# Patient Record
Sex: Female | Born: 1995 | Race: White | Hispanic: No | Marital: Single | Smoking: Never smoker
Health system: Southern US, Community
[De-identification: ages and names within clinical notes are randomized; demographics above are authoritative.]

---

## 2017-06-21 ENCOUNTER — Ambulatory Visit (HOSPITAL_COMMUNITY)
Admission: EM | Admit: 2017-06-21 | Discharge: 2017-06-21 | Disposition: A | Discharge status: Home / Self Care | Payer: Self-pay | Attending: Family Medicine | Admitting: Family Medicine

## 2017-06-21 ENCOUNTER — Encounter (HOSPITAL_COMMUNITY): Payer: Self-pay | Admitting: Physician Assistant

## 2017-06-21 DIAGNOSIS — S81811A Laceration without foreign body, right lower leg, initial encounter: Secondary | ICD-10-CM

## 2017-06-21 NOTE — ED Provider Notes (Signed)
[facility]    CSN: [id number] Arrival date & time: 06/21/17  1823     History   Chief Complaint Chief Complaint  Patient presents with  . Leg Injury    HPI Heather Stein is a 22 y.o. female.   22 year old female comes in for multiple lacerations to the right lower extremity. States was carrying glass at work, accidentally slipped, and cut her into different locations.  Bleeding controlled.  Came in for evaluation.  Has not taken anything for the pain.  Denies numbness, tingling.  Able to move extremities without problem.  Does not recall when her last  tetanus was.      History reviewed. No pertinent past medical history.  There are no active problems to display for this patient.   History reviewed. No pertinent surgical history.  OB History    No data available       Home Medications    Prior to Admission medications   Medication Sig Start Date End Date Taking? Authorizing Provider  AZITHROMYCIN PO Take by mouth.   Yes [provider]    Family History No family history on file.  Social History Social History   Tobacco Use  . Smoking status: Not on file  Substance Use Topics  . Alcohol use: Not on file  . Drug use: Not on file     Allergies   Patient has no allergy information on record.   Review of Systems Review of Systems  Reason unable to perform ROS: See HPI as above.     Physical Exam Triage Vital Signs ED Triage Vitals  Enc Vitals Group     BP 06/21/17 1852 101/83     Pulse Rate 06/21/17 1852 83     Resp --      Temp 06/21/17 1852 98.3 F (36.8 C)     Temp Source 06/21/17 1852 Oral     SpO2 06/21/17 1852 99 %     Weight --      Height --      Head Circumference --      Peak Flow --      Pain Score 06/21/17 1849 2     Pain Loc --      Pain Edu? --      Excl. in GC? --    No data found.  Updated Vital Signs BP 101/83 (BP Location: Left Arm)   Pulse 83   Temp 98.3 F (36.8 C) (Oral)   SpO2 99%    Physical Exam  Constitutional: She is oriented to person, place, and time. She appears well-developed and well-nourished. No distress.  HENT:  Head: Normocephalic and atraumatic.  Eyes: Conjunctivae are normal. Pupils are equal, round, and reactive to light.  Musculoskeletal:  2 cm laceration to the right anterior thigh. 3.5 cm laceration to the right anterior shin. Bleeding controlled. No foreign body seen. Sensation intact. Able to move extremities without problems.   Neurological: She is alert and oriented to person, place, and time.  Skin: Skin is warm and dry.     UC Treatments / Results  Labs (all labs ordered are listed, but only abnormal results are displayed) Labs Reviewed - No data to display  EKG  EKG Interpretation None       Radiology No results found.  Procedures Laceration Repair Date/Time: 06/21/2017 8:19 PM Performed by: Belinda FisherYu, Teliyah Royal V, PA-C Authorized by: Mardella LaymanHagler, Brian, MD   Consent:    Consent obtained:  Verbal   Consent given  by:  Patient   Risks discussed:  Infection, pain, poor cosmetic result and poor wound healing   Alternatives discussed:  No treatment Anesthesia (see MAR for exact dosages):    Anesthesia method:  Local infiltration   Local anesthetic:  Lidocaine 2% WITH epi Laceration details:    Location:  Leg   Leg location: right upper and lower leg.   Wound length (cm): 2, 3.5.   Laceration depth: 1, 1. Repair type:    Repair type:  Simple Exploration:    Hemostasis achieved with:  Direct pressure and epinephrine   Wound exploration: wound explored through full range of motion and entire depth of wound probed and visualized     Wound extent: no foreign bodies/material noted, no nerve damage noted and no tendon damage noted   Treatment:    Area cleansed with:  Hibiclens   Amount of cleaning:  Standard   Irrigation solution:  Sterile saline   Irrigation method:  Pressure wash Skin repair:    Repair method:  Sutures   Suture size:   4-0   Suture material:  Nylon   Suture technique:  Simple interrupted   Number of sutures: 2 (thigh); 3 (shin) Approximation:    Approximation:  Close   Vermilion border: well-aligned   Post-procedure details:    Dressing:  Antibiotic ointment and bulky dressing   Patient tolerance of procedure:  Tolerated well, no immediate complications    (including critical care time)  Medications Ordered in UC Medications - No data to display   Initial Impression / Assessment and Plan / UC Course  I have reviewed the triage vital signs and the nursing notes.  Pertinent labs & imaging results that were available during my care of the patient were reviewed by me and considered in my medical decision making (see chart for details).    Patient tolerated laceration repair well without immediate complications.  2 sutures applied to laceration on the thigh, 3 sutures applied to laceration of the shin.  Wound care instructions given.  Tylenol/Motrin for pain.  Patient with unknown  last tetanus, though she thinks it is within the last 10 years, states she will contact her mother for information, and will return if tetanus is greater than the last 5 years.  Patient to follow-up in 7 days for suture removal.  Return precautions given.  Patient expresses understanding and agrees to plan.  Final Clinical Impressions(s) / UC Diagnoses   Final diagnoses:  Laceration of multiple sites of right lower extremity, initial encounter    ED Discharge Orders    None       Lurline Idol 06/21/17 2029

## 2017-06-21 NOTE — Discharge Instructions (Signed)
2 stitches was put on your upper thigh.  3 stitches put on your shin. Keep area clean and dry.  You can wash gently with soap and water.  Tylenol/motrin for pain and fever. Monitor for any spreading redness, increased warmth, fever, follow-up for reevaluation.  Otherwise follow-up in 7 days for suture removal.

## 2017-06-21 NOTE — ED Triage Notes (Signed)
Per pt glass broke and hit her right leg,

## 2017-08-25 ENCOUNTER — Ambulatory Visit (HOSPITAL_COMMUNITY)
Admission: EM | Admit: 2017-08-25 | Discharge: 2017-08-25 | Disposition: A | Discharge status: Home / Self Care | Attending: Family Medicine | Admitting: Family Medicine

## 2017-08-25 ENCOUNTER — Encounter (HOSPITAL_COMMUNITY): Payer: Self-pay

## 2017-08-25 ENCOUNTER — Other Ambulatory Visit: Payer: Self-pay

## 2017-08-25 DIAGNOSIS — J029 Acute pharyngitis, unspecified: Secondary | ICD-10-CM

## 2017-08-25 DIAGNOSIS — J358 Other chronic diseases of tonsils and adenoids: Secondary | ICD-10-CM

## 2017-08-25 MED ORDER — AMOXICILLIN 875 MG PO TABS: 875.0000 mg | ORAL_TABLET | Freq: Two times a day (BID) | ORAL | 0 refills | Status: DC

## 2017-08-25 NOTE — ED Provider Notes (Signed)
  MRN: [id number] DOB: 08/26/1995  Subjective:   Heather Stein is a 22 y.o. female presenting for 2-day history of progressively worsening sore throat, subjective fever, bilateral neck discomfort.  Patient also feels like she has a voice change.  Denies cough, sinus congestion, ear pain, sinus pain.  He has been taking Motrin for her throat pain with minimal relief.  Patient has an IUD, Mirena.   Allergies  Allergen Reactions  . Estrogens     Past medical history of infectious mononucleosis.  Denies past surgical history.  Objective:   Vitals: BP 100/61   Pulse 77   Temp 98.6 F (37 C)   Resp 18   Wt 135 lb (61.2 kg)   LMP 08/24/2017   SpO2 100%   Physical Exam  Constitutional: She is oriented to person, place, and time. She appears well-developed and well-nourished.  HENT:  Bilateral tonsillar erythema and swelling with exudates.  Muffled voice noted.  Eyes: Right eye exhibits no discharge. Left eye exhibits no discharge.  Neck: Normal range of motion. Neck supple.  Bilateral cervical lymph node tenderness without lymphadenopathy.  Cardiovascular: Normal rate.  Pulmonary/Chest: Effort normal.  Neurological: She is alert and oriented to person, place, and time.  Skin: Skin is warm and dry.  Psychiatric: She has a normal mood and affect.    Assessment and Plan :   Acute pharyngitis, unspecified etiology  Tonsillar exudate  Start amoxicillin for empiric treatment of strep pharyngitis even physical exam findings. Counseled patient on potential for adverse effects with medications prescribed today, patient verbalized understanding. Return-to-clinic precautions discussed, patient verbalized understanding.  Discussed possibility of morbilliform rash if this turns out to be mono instead of strep.  Patient verbalized understanding.   Wallis BambergMani, Hollis Tuller, [place]-C 08/25/17 1946

## 2017-08-25 NOTE — ED Triage Notes (Signed)
Pt presents with complaints of headache, body aches and sore throat x 2 days.

## 2017-12-09 ENCOUNTER — Ambulatory Visit (HOSPITAL_COMMUNITY)
Admission: EM | Admit: 2017-12-09 | Discharge: 2017-12-09 | Disposition: A | Discharge status: Home / Self Care | Attending: Family Medicine | Admitting: Family Medicine

## 2017-12-09 ENCOUNTER — Encounter (HOSPITAL_COMMUNITY): Payer: Self-pay | Admitting: Emergency Medicine

## 2017-12-09 DIAGNOSIS — J020 Streptococcal pharyngitis: Secondary | ICD-10-CM | POA: Diagnosis not present

## 2017-12-09 LAB — POCT RAPID STREP A: STREPTOCOCCUS, GROUP A SCREEN (DIRECT): POSITIVE — AB

## 2017-12-09 MED ORDER — ACETAMINOPHEN 325 MG PO TABS
ORAL_TABLET | ORAL | Status: AC
  Filled 2017-12-09: qty 2

## 2017-12-09 MED ORDER — AMOXICILLIN 500 MG PO CAPS: 500.0000 mg | ORAL_CAPSULE | Freq: Two times a day (BID) | ORAL | 0 refills | Status: AC

## 2017-12-09 MED ORDER — ACETAMINOPHEN 325 MG PO TABS
650.0000 mg | ORAL_TABLET | Freq: Once | ORAL | Status: AC
  Administered 2017-12-09: 650 mg via ORAL

## 2017-12-09 MED ORDER — ONDANSETRON 4 MG PO TBDP: 4.0000 mg | ORAL_TABLET | Freq: Three times a day (TID) | ORAL | 0 refills | Status: AC | PRN

## 2017-12-09 NOTE — ED Provider Notes (Signed)
[facility]    CSN: [id number] Arrival date & time: 12/09/17  1047     History   Chief Complaint Chief Complaint  Patient presents with  . Sore Throat    HPI Heather Stein is a 22 y.o. female.   22 year old female comes in for 2-day history of sore throat, chills, body aches.  States has some clearing of the throat without obvious cough.  Denies rhinorrhea, nasal congestion.  unknown fever but with alternating hot and chills.  Painful swallowing without trouble breathing, swelling of the throat, changes in voice, drooling, tripoding. Nausea with 1 episode of nonbilious nonbloody emesis. Has been able to tolerate fluids and food since. Denies abdominal pain. No obvious sick contact.  Never smoker.  Has not taken anything for the symptoms.     History reviewed. No pertinent past medical history.  There are no active problems to display for this patient.   History reviewed. No pertinent surgical history.  OB History   None      Home Medications    Prior to Admission medications   Medication Sig Start Date End Date Taking? Authorizing Provider  amoxicillin (AMOXIL) 500 MG capsule Take 1 capsule (500 mg total) by mouth 2 (two) times daily for 10 days. 12/09/17 12/19/17  Belinda FisherYu, Amy V, PA-C  levonorgestrel (MIRENA) 20 MCG/24HR IUD 1 each by Intrauterine route once.    [provider]  ondansetron (ZOFRAN ODT) 4 MG disintegrating tablet Take 1 tablet (4 mg total) by mouth every 8 (eight) hours as needed for nausea or vomiting. 12/09/17   Belinda FisherYu, Amy V, PA-C    Family History Family History  Problem Relation Age of Onset  . Healthy Mother   . Healthy Father     Social History Social History   Tobacco Use  . Smoking status: Never Smoker  . Smokeless tobacco: Current User  Substance Use Topics  . Alcohol use: Yes    Frequency: Never    Comment: occaisionally   . Drug use: Never     Allergies   Estrogens   Review of Systems Review of Systems  Reason  unable to perform ROS: See HPI as above.     Physical Exam Triage Vital Signs ED Triage Vitals [12/09/17 1111]  Enc Vitals Group     BP (!) 101/54     Pulse Rate (!) 111     Resp 18     Temp (!) 101.2 F (38.4 C)     Temp Source Oral     SpO2 98 %     Weight      Height      Head Circumference      Peak Flow      Pain Score      Pain Loc      Pain Edu?      Excl. in GC?    No data found.  Updated Vital Signs BP (!) 101/54 (BP Location: Left Arm)   Pulse (!) 111   Temp (!) 101.2 F (38.4 C) (Oral)   Resp 18   SpO2 98%   Physical Exam  Constitutional: She is oriented to person, place, and time. She appears well-developed and well-nourished.  Non-toxic appearance. She does not appear ill. No distress.  HENT:  Head: Normocephalic and atraumatic.  Right Ear: Tympanic membrane, external ear and ear canal normal. Tympanic membrane is not erythematous and not bulging.  Left Ear: Tympanic membrane, external ear and ear canal normal. Tympanic membrane is not erythematous  and not bulging.  Nose: Nose normal. Right sinus exhibits no maxillary sinus tenderness and no frontal sinus tenderness. Left sinus exhibits no maxillary sinus tenderness and no frontal sinus tenderness.  Mouth/Throat: Uvula is midline and mucous membranes are normal. Posterior oropharyngeal erythema present. Tonsils are 3+ on the right. Tonsils are 3+ on the left. No tonsillar exudate.  Eyes: Pupils are equal, round, and reactive to light. Conjunctivae are normal.  Neck: Normal range of motion. Neck supple.  Cardiovascular: Regular rhythm and normal heart sounds. Tachycardia present. Exam reveals no gallop and no friction rub.  No murmur heard. Pulmonary/Chest: Effort normal and breath sounds normal. No stridor. No respiratory distress. She has no decreased breath sounds. She has no wheezes. She has no rhonchi. She has no rales.  Lymphadenopathy:    She has no cervical adenopathy.  Neurological: She is alert  and oriented to person, place, and time.  Skin: Skin is warm and dry.  Psychiatric: She has a normal mood and affect. Her behavior is normal. Judgment normal.   UC Treatments / Results  Labs (all labs ordered are listed, but only abnormal results are displayed) Labs Reviewed  POCT RAPID STREP A - Abnormal; Notable for the following components:      Result Value   Streptococcus, Group A Screen (Direct) POSITIVE (*)    All other components within normal limits    EKG None  Radiology No results found.  Procedures Procedures (including critical care time)  Medications Ordered in UC Medications  acetaminophen (TYLENOL) tablet 650 mg (650 mg Oral Given 12/09/17 1127)    Initial Impression / Assessment and Plan / UC Course  I have reviewed the triage vital signs and the nursing notes.  Pertinent labs & imaging results that were available during my care of the patient were reviewed by me and considered in my medical decision making (see chart for details).    Rapid strep positive. Start antibiotic as directed. zofran for nausea. Symptomatic treatment as needed. Return precautions given.   Final Clinical Impressions(s) / UC Diagnoses   Final diagnoses:  Strep pharyngitis    ED Prescriptions    Medication Sig Dispense Auth. Provider   amoxicillin (AMOXIL) 500 MG capsule Take 1 capsule (500 mg total) by mouth 2 (two) times daily for 10 days. 20 capsule Yu, Amy V, PA-C   ondansetron (ZOFRAN ODT) 4 MG disintegrating tablet Take 1 tablet (4 mg total) by mouth every 8 (eight) hours as needed for nausea or vomiting. 5 tablet Threasa Alpha, [place] 12/09/17 1155

## 2017-12-09 NOTE — Discharge Instructions (Signed)
Rapid strep positive. Start antibiotics as directed. Zofran as needed for nausea/vomiting. Tylenol/Motrin for fever and pain. Monitor for any worsening of symptoms, trouble breathing, trouble swallowing, swelling of the throat, leaning forward to breath, drooling, follow up here or at the emergency department for reevaluation.  For sore throat/cough try using a honey-based tea. Use 3 teaspoons of honey with juice squeezed from half lemon. Place shaved pieces of ginger into 1/2-1 cup of water and warm over stove top. Then mix the ingredients and repeat every 4 hours as needed.

## 2017-12-09 NOTE — ED Triage Notes (Signed)
Pt sts sore throat with hx of strep; fever noted

## 2018-07-17 ENCOUNTER — Other Ambulatory Visit: Payer: Self-pay

## 2018-07-17 ENCOUNTER — Emergency Department (HOSPITAL_COMMUNITY)
Admission: EM | Admit: 2018-07-17 | Discharge: 2018-07-17 | Disposition: A | Discharge status: Home / Self Care | Attending: Emergency Medicine | Admitting: Emergency Medicine

## 2018-07-17 ENCOUNTER — Emergency Department (HOSPITAL_COMMUNITY)

## 2018-07-17 ENCOUNTER — Encounter (HOSPITAL_COMMUNITY): Payer: Self-pay | Admitting: Emergency Medicine

## 2018-07-17 DIAGNOSIS — F17220 Nicotine dependence, chewing tobacco, uncomplicated: Secondary | ICD-10-CM | POA: Insufficient documentation

## 2018-07-17 DIAGNOSIS — R0789 Other chest pain: Secondary | ICD-10-CM | POA: Diagnosis not present

## 2018-07-17 DIAGNOSIS — J029 Acute pharyngitis, unspecified: Secondary | ICD-10-CM | POA: Insufficient documentation

## 2018-07-17 LAB — GROUP A STREP BY PCR: Group A Strep by PCR: NOT DETECTED

## 2018-07-17 MED ORDER — DEXAMETHASONE 4 MG PO TABS
10.0000 mg | ORAL_TABLET | Freq: Once | ORAL | Status: AC
  Administered 2018-07-17: 10 mg via ORAL
  Filled 2018-07-17: qty 2

## 2018-07-17 NOTE — ED Provider Notes (Signed)
[facility]EMERGENCY DEPT Provider Note   CSN: [id number] Arrival date & time: 07/17/18  1402    History   Chief Complaint Chief Complaint  Patient presents with  . Sore Throat    HPI Heather Stein is a 23 y.o. female who is previously healthy who presents with a sore throat for the past 3 days.  She was around her roommate who has strep throat.  She has pain with swallowing.  She denies fever, nasal congestion, cough, abdominal pain, nausea, vomiting.  Patient also reports she has had an intermittent left-sided chest pain that is pleuritic for the past 40 days.  She reports she vapes and think it may be related.  She has not gotten it checked out.  She denies any trouble breathing.  She denies any new leg pain or swelling, recent long trips, surgeries, known cancer, history of blood clots.  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  She was put on Mirena IUD.  She reports the pain is sometimes worse when she eats as well and has had epigastric pain.  She denies any of that now.     HPI  History reviewed. No pertinent past medical history.  There are no active problems to display for this patient.   History reviewed. No pertinent surgical history.   OB History   No obstetric history on file.      Home Medications    Prior to Admission medications   Medication Sig Start Date End Date Taking? Authorizing Provider  levonorgestrel (MIRENA) 20 MCG/24HR IUD 1 each by Intrauterine route once.    [provider]  ondansetron (ZOFRAN ODT) 4 MG disintegrating tablet Take 1 tablet (4 mg total) by mouth every 8 (eight) hours as needed for nausea or vomiting. 12/09/17   Belinda Fisher, PA-C    Family History Family History  Problem Relation Age of Onset  . Healthy Mother   . Healthy Father     Social History Social History   Tobacco Use  . Smoking status: Never Smoker  . Smokeless tobacco: Current  User  Substance Use Topics  . Alcohol use: Yes    Frequency: Never    Comment: occaisionally   . Drug use: Never     Allergies   Estrogens   Review of Systems Review of Systems  Constitutional: Negative for chills and fever.  HENT: Positive for sore throat. Negative for facial swelling.   Respiratory: Negative for cough and shortness of breath.   Cardiovascular: Positive for chest pain (none at this time). Negative for leg swelling.  Gastrointestinal: Positive for abdominal pain (none at this time). Negative for nausea and vomiting.  Genitourinary: Negative for dysuria.  Musculoskeletal: Negative for back pain.  Skin: Negative for rash and wound.  Neurological: Negative for headaches.  Psychiatric/Behavioral: The patient is not nervous/anxious.      Physical Exam Updated Vital Signs BP 98/65 (BP Location: Right Arm)   Pulse 74   Temp 97.8 F (36.6 C) (Oral)   Resp 18   SpO2 99%   Physical Exam Vitals signs and nursing note reviewed.  Constitutional:      General: She is not in acute distress.    Appearance: She is well-developed. She is not diaphoretic.  HENT:     Head: Normocephalic and atraumatic.     Mouth/Throat:     Pharynx: No oropharyngeal exudate.     Tonsils: Tonsillar exudate present. No  tonsillar abscesses. Swelling: 2+ on the right. 2+ on the left.  Eyes:     General: No scleral icterus.       Right eye: No discharge.        Left eye: No discharge.     Conjunctiva/sclera: Conjunctivae normal.     Pupils: Pupils are equal, round, and reactive to light.  Neck:     Musculoskeletal: Normal range of motion and neck supple.     Thyroid: No thyromegaly.  Cardiovascular:     Rate and Rhythm: Normal rate and regular rhythm.     Heart sounds: Normal heart sounds. No murmur. No friction rub. No gallop.   Pulmonary:     Effort: Pulmonary effort is normal. No respiratory distress.     Breath sounds: Normal breath sounds. No stridor. No wheezing or rales.   Chest:     Chest wall: Tenderness (mild) present.  Abdominal:     General: Bowel sounds are normal. There is no distension.     Palpations: Abdomen is soft.     Tenderness: There is no abdominal tenderness. There is no guarding or rebound.  Musculoskeletal:     Right lower leg: She exhibits no swelling. No edema.     Left lower leg: She exhibits no swelling. No edema.  Lymphadenopathy:     Cervical: No cervical adenopathy (no palpable lymph nodes, but some left tenderness).  Skin:    General: Skin is warm and dry.     Coloration: Skin is not pale.     Findings: No rash.  Neurological:     Mental Status: She is alert.     Coordination: Coordination normal.      ED Treatments / Results  Labs (all labs ordered are listed, but only abnormal results are displayed) Labs Reviewed  GROUP A STREP BY PCR    EKG None  Radiology Dg Chest 2 View  Result Date: 07/17/2018 CLINICAL DATA:  Chest pain EXAM: CHEST - 2 VIEW COMPARISON:  None. FINDINGS: Lungs are clear. The heart size and pulmonary vascularity are normal. No adenopathy. No bone lesions. No pneumothorax. IMPRESSION: No edema or consolidation. Electronically Signed   By: Bretta Bang III M.D.   On: 07/17/2018 16:04    Procedures Procedures (including critical care time)  Medications Ordered in ED Medications  dexamethasone (DECADRON) tablet 10 mg (has no administration in time range)     Initial Impression / Assessment and Plan / ED Course  I have reviewed the triage vital signs and the nursing notes.  Pertinent labs & imaging results that were available during my care of the patient were reviewed by me and considered in my medical decision making (see chart for details).        Patient with a 3-day history of sore throat after exposure to reported strep by her roommate.  Strep PCR today is negative.  Single dose Decadron given in the ED.  Supportive treatment discussed.  Chest x-ray is negative.  Patient's  pain has been intermittent for several weeks.  Very low suspicion of ACS or PE.  Patient is PERC negative.  Suspect related to vaping use or chest wall pain.  NSAIDs discussed.  Counseled on importance of quitting vaping.  Patient reports she has been cutting back.  Patient advised to follow-up and establish care with a PCP in the area.  She is given very strict return precautions including worsening and constant chest pain, shortness of breath, lightheadedness or syncope, trismus, inability to handle her  saliva, or any other concerning symptoms.  She understands and agrees with plan.  Patient vital stable her ED course and discharged in satisfactory condition. I discussed patient case with Dr. Patria Mane who guided the patient's management and agrees with plan.   Final Clinical Impressions(s) / ED Diagnoses   Final diagnoses:  Viral pharyngitis  Atypical chest pain    ED Discharge Orders    None       Emi Holes, Cordelia Poche 07/17/18 1701    Azalia Bilis, MD 07/19/18 [phone]

## 2018-07-17 NOTE — Discharge Instructions (Addendum)
Take ibuprofen or Aleve as prescribed over-the-counter, as needed for your pain.  You can alternate with Tylenol.  Please establish care with a primary care provider follow-up with them if your chest pain is continuing.  Recommend trying to stop vaping.  Please return the emergency department if you develop any more persistent chest pain, shortness of breath, passing out, asymmetry in your throat, difficulty swallowing your own saliva, lockjaw, or any other concerning symptoms.

## 2018-07-17 NOTE — ED Triage Notes (Signed)
Patient here from home with complaints of sore throat x3 days. Also reports left lung pain for "40 days".

## 2018-10-07 ENCOUNTER — Encounter (HOSPITAL_COMMUNITY): Payer: Self-pay | Admitting: Emergency Medicine

## 2018-10-07 ENCOUNTER — Emergency Department (HOSPITAL_COMMUNITY)
Admission: EM | Admit: 2018-10-07 | Discharge: 2018-10-07 | Disposition: A | Discharge status: Home / Self Care | Attending: Emergency Medicine | Admitting: Emergency Medicine

## 2018-10-07 ENCOUNTER — Other Ambulatory Visit: Payer: Self-pay

## 2018-10-07 DIAGNOSIS — Z203 Contact with and (suspected) exposure to rabies: Secondary | ICD-10-CM | POA: Insufficient documentation

## 2018-10-07 DIAGNOSIS — Y929 Unspecified place or not applicable: Secondary | ICD-10-CM | POA: Insufficient documentation

## 2018-10-07 DIAGNOSIS — Y999 Unspecified external cause status: Secondary | ICD-10-CM | POA: Insufficient documentation

## 2018-10-07 DIAGNOSIS — Z23 Encounter for immunization: Secondary | ICD-10-CM | POA: Insufficient documentation

## 2018-10-07 DIAGNOSIS — Z2914 Encounter for prophylactic rabies immune globin: Secondary | ICD-10-CM | POA: Insufficient documentation

## 2018-10-07 DIAGNOSIS — W540XXA Bitten by dog, initial encounter: Secondary | ICD-10-CM | POA: Insufficient documentation

## 2018-10-07 DIAGNOSIS — S6982XA Other specified injuries of left wrist, hand and finger(s), initial encounter: Secondary | ICD-10-CM | POA: Insufficient documentation

## 2018-10-07 DIAGNOSIS — Y9389 Activity, other specified: Secondary | ICD-10-CM | POA: Insufficient documentation

## 2018-10-07 MED ORDER — RABIES VACCINE, PCEC IM SUSR
1.0000 mL | Freq: Once | INTRAMUSCULAR | Status: AC
  Administered 2018-10-07: 1 mL via INTRAMUSCULAR
  Filled 2018-10-07: qty 1

## 2018-10-07 MED ORDER — RABIES IMMUNE GLOBULIN 150 UNIT/ML IM INJ
20.0000 [IU]/kg | INJECTION | Freq: Once | INTRAMUSCULAR | Status: AC
  Administered 2018-10-07: 14:00:00 1125 [IU]
  Filled 2018-10-07: qty 8

## 2018-10-07 NOTE — ED Triage Notes (Signed)
Pt reports that Monday was bit on tip of left middle finger by a stray dog. Was told that needs to come and possibly get rabies shots.

## 2018-10-07 NOTE — Discharge Instructions (Addendum)
Please follow up on Day 3 at Urgent care for your next vaccination - instruction have been included with this paperwork Continue local wound care - wash area with soap and water and watch for signs of infection (worsening redness, pain, drainage) Return to the Emergency Dept as needed

## 2018-10-07 NOTE — ED Notes (Signed)
ED Provider at bedside. 

## 2018-10-07 NOTE — ED Provider Notes (Signed)
[facility]-EMERGENCY DEPT Provider Note   CSN: [id number] Arrival date & time: 10/07/18  1027    History   Chief Complaint Chief Complaint  Patient presents with  . Animal Bite    HPI Heather Stein is a 23 y.o. female who presents with an animal bite. No significant PMH. She states that 2 days ago she was with a friend trying to catch a stray dog. She notes the dog had a collar however it became fearful and bit her on the left distal middle finger. The bite did break the skin. She states she has been taking care of the wound and it has developed a blood blister in the area but is healing well. She was told that she should come to the ED for a rabies shot since they were not able to catch the dog and does not know its whereabouts. She thinks she may be up to date on tetanus.    HPI  History reviewed. No pertinent past medical history.  There are no active problems to display for this patient.   History reviewed. No pertinent surgical history.   OB History   No obstetric history on file.      Home Medications    Prior to Admission medications   Medication Sig Start Date End Date Taking? Authorizing Provider  levonorgestrel (MIRENA) 20 MCG/24HR IUD 1 each by Intrauterine route once.    [provider]  ondansetron (ZOFRAN ODT) 4 MG disintegrating tablet Take 1 tablet (4 mg total) by mouth every 8 (eight) hours as needed for nausea or vomiting. 12/09/17   Belinda Fisher, PA-C    Family History Family History  Problem Relation Age of Onset  . Healthy Mother   . Healthy Father     Social History Social History   Tobacco Use  . Smoking status: Never Smoker  . Smokeless tobacco: Current User  Substance Use Topics  . Alcohol use: Yes    Frequency: Never    Comment: occaisionally   . Drug use: Never     Allergies   Estrogens   Review of Systems Review of Systems  Constitutional: Negative for fever.  Skin: Positive for wound.   Neurological: Negative for weakness.     Physical Exam Updated Vital Signs BP 92/64 (BP Location: Left Arm)   Pulse 62   Temp 98.6 F (37 C) (Oral)   Resp 17   SpO2 100%   Physical Exam Vitals signs and nursing note reviewed.  Constitutional:      General: She is not in acute distress.    Appearance: Normal appearance. She is well-developed. She is not ill-appearing.  HENT:     Head: Normocephalic and atraumatic.  Eyes:     General: No scleral icterus.       Right eye: No discharge.        Left eye: No discharge.     Conjunctiva/sclera: Conjunctivae normal.     Pupils: Pupils are equal, round, and reactive to light.  Neck:     Musculoskeletal: Normal range of motion.  Cardiovascular:     Rate and Rhythm: Normal rate.  Pulmonary:     Effort: Pulmonary effort is normal. No respiratory distress.  Abdominal:     General: There is no distension.  Musculoskeletal:     Comments: Small blood blister over left distal middle finger tip. No surrounding redness or drainage  Skin:    General: Skin is warm and dry.  Neurological:  Mental Status: She is alert and oriented to person, place, and time.  Psychiatric:        Behavior: Behavior normal.      ED Treatments / Results  Labs (all labs ordered are listed, but only abnormal results are displayed) Labs Reviewed - No data to display  EKG None  Radiology No results found.  Procedures Procedures (including critical care time)  Medications Ordered in ED Medications  rabies vaccine (RABAVERT) injection 1 mL (1 mL Intramuscular Given 10/07/18 1341)  rabies immune globulin (HYPERAB/KEDRAB) injection 1,125 Units (1,125 Units Infiltration Given 10/07/18 1343)     Initial Impression / Assessment and Plan / ED Course  I have reviewed the triage vital signs and the nursing notes.  Pertinent labs & imaging results that were available during my care of the patient were reviewed by me and considered in my medical decision  making (see chart for details).  23 year old female presents with bite by a stray dog 2 days ago. Wound appears to be healing well. She does not know where the dog is and thus rabies series would be indicated. She believes her tetanus is UTD and would rather hold on this today. She was given rabies vaccines and immunoglobulin. She was instructed to continue local wound care and to f/u with UC for remaining vaccinations.  Final Clinical Impressions(s) / ED Diagnoses   Final diagnoses:  Dog bite, initial encounter  Need for post exposure prophylaxis for rabies    ED Discharge Orders    None       Bethel BornGekas, Tija Biss Marie, PA-C 10/08/18 [id number]    Derwood KaplanNanavati, Ankit, MD 10/08/18 0900

## 2018-10-10 ENCOUNTER — Other Ambulatory Visit: Payer: Self-pay

## 2018-10-10 ENCOUNTER — Encounter (HOSPITAL_COMMUNITY): Payer: Self-pay | Admitting: Emergency Medicine

## 2018-10-10 ENCOUNTER — Ambulatory Visit (HOSPITAL_COMMUNITY)
Admission: EM | Admit: 2018-10-10 | Discharge: 2018-10-10 | Disposition: A | Discharge status: Home / Self Care | Payer: Self-pay | Attending: Family Medicine | Admitting: Family Medicine

## 2018-10-10 DIAGNOSIS — Z23 Encounter for immunization: Secondary | ICD-10-CM

## 2018-10-10 DIAGNOSIS — Z203 Contact with and (suspected) exposure to rabies: Secondary | ICD-10-CM

## 2018-10-10 MED ORDER — RABIES VACCINE, PCEC IM SUSR
INTRAMUSCULAR | Status: AC
  Filled 2018-10-10: qty 1

## 2018-10-10 MED ORDER — RABIES VACCINE, PCEC IM SUSR
1.0000 mL | Freq: Once | INTRAMUSCULAR | Status: AC
  Administered 2018-10-10: 1 mL via INTRAMUSCULAR

## 2018-10-10 NOTE — ED Provider Notes (Signed)
  [place]   [id number] 10/10/18 Arrival Time: 1004  ASSESSMENT & PLAN:  1. Contact with and (suspected) exposure to rabies     Meds ordered this encounter  Medications  . rabies vaccine (RABAVERT) injection 1 mL    Here for continuation of rabies series. Has questions regarding "small bumps" at sites of injection. She does have what appears to be scattered punctate bumps on R LE and L UE. Question hair follicle irritation/inflammation. Unsure to say if these are related to rabies series. No signs of infection. Areas are not worsening.  OK to continue series today.    Vanessa Kick, MD 10/10/18 1037

## 2018-10-10 NOTE — Discharge Instructions (Signed)
Return for next injection

## 2018-10-10 NOTE — ED Triage Notes (Addendum)
Patient in department for rabies injection #2 (day 3) in rabies series  Patient is asking questions about scattered bumps on extremities.  Notified patient a provider would evaluate patient concerns.  Notified provider staff

## 2018-10-14 ENCOUNTER — Encounter (HOSPITAL_COMMUNITY): Payer: Self-pay | Admitting: Emergency Medicine

## 2018-10-14 ENCOUNTER — Other Ambulatory Visit: Payer: Self-pay

## 2018-10-14 ENCOUNTER — Ambulatory Visit (HOSPITAL_COMMUNITY)
Admission: EM | Admit: 2018-10-14 | Discharge: 2018-10-14 | Disposition: A | Discharge status: Home / Self Care | Payer: Self-pay | Attending: Family Medicine | Admitting: Family Medicine

## 2018-10-14 DIAGNOSIS — Z23 Encounter for immunization: Secondary | ICD-10-CM

## 2018-10-14 DIAGNOSIS — Z203 Contact with and (suspected) exposure to rabies: Secondary | ICD-10-CM

## 2018-10-14 MED ORDER — RABIES VACCINE, PCEC IM SUSR
INTRAMUSCULAR | Status: AC
  Filled 2018-10-14: qty 1

## 2018-10-14 MED ORDER — RABIES VACCINE, PCEC IM SUSR
1.0000 mL | Freq: Once | INTRAMUSCULAR | Status: AC
  Administered 2018-10-14: 1 mL via INTRAMUSCULAR

## 2018-10-14 NOTE — ED Triage Notes (Signed)
Pt presents for 3rd in series of rabbies vaccinations.  Denies need to speak to provider at this time.

## 2019-08-26 ENCOUNTER — Ambulatory Visit: Payer: Self-pay | Attending: Internal Medicine

## 2019-08-26 DIAGNOSIS — Z23 Encounter for immunization: Secondary | ICD-10-CM

## 2019-09-20 ENCOUNTER — Ambulatory Visit: Payer: Self-pay | Attending: Internal Medicine

## 2019-09-20 DIAGNOSIS — Z23 Encounter for immunization: Secondary | ICD-10-CM

## 2019-09-20 NOTE — Progress Notes (Signed)
   Covid-[facility] Vaccination Clinic  Name:  Shay Jhaveri    MRN: [id number] DOB: 02-Oct-1995  09/20/2019  Ms. Fojtik was observed post Covid-19 immunization for 15 minutes without incident. She was provided with Vaccine Information Sheet and instruction to access the V-Safe system.   Ms. Liszewski was instructed to call 911 with any severe reactions post vaccine: Marland Kitchen Difficulty breathing  . Swelling of face and throat  . A fast heartbeat  . A bad rash all over body  . Dizziness and weakness   Immunizations Administered    Name Date Dose VIS Date Route   Pfizer COVID-19 Vaccine 09/20/2019  3:54 PM 0.3 mL 06/30/2018 Intramuscular   Manufacturer: [employer], [employer]   Lot: [id number]   NDC: [address]-1

## 2020-01-08 IMAGING — CR CHEST - 2 VIEW
2 series · 2 of 2 positions shown · non-contrast
Comparison: None.

CLINICAL DATA: Chest pain

EXAM:
CHEST - 2 VIEW

[w chest pa]
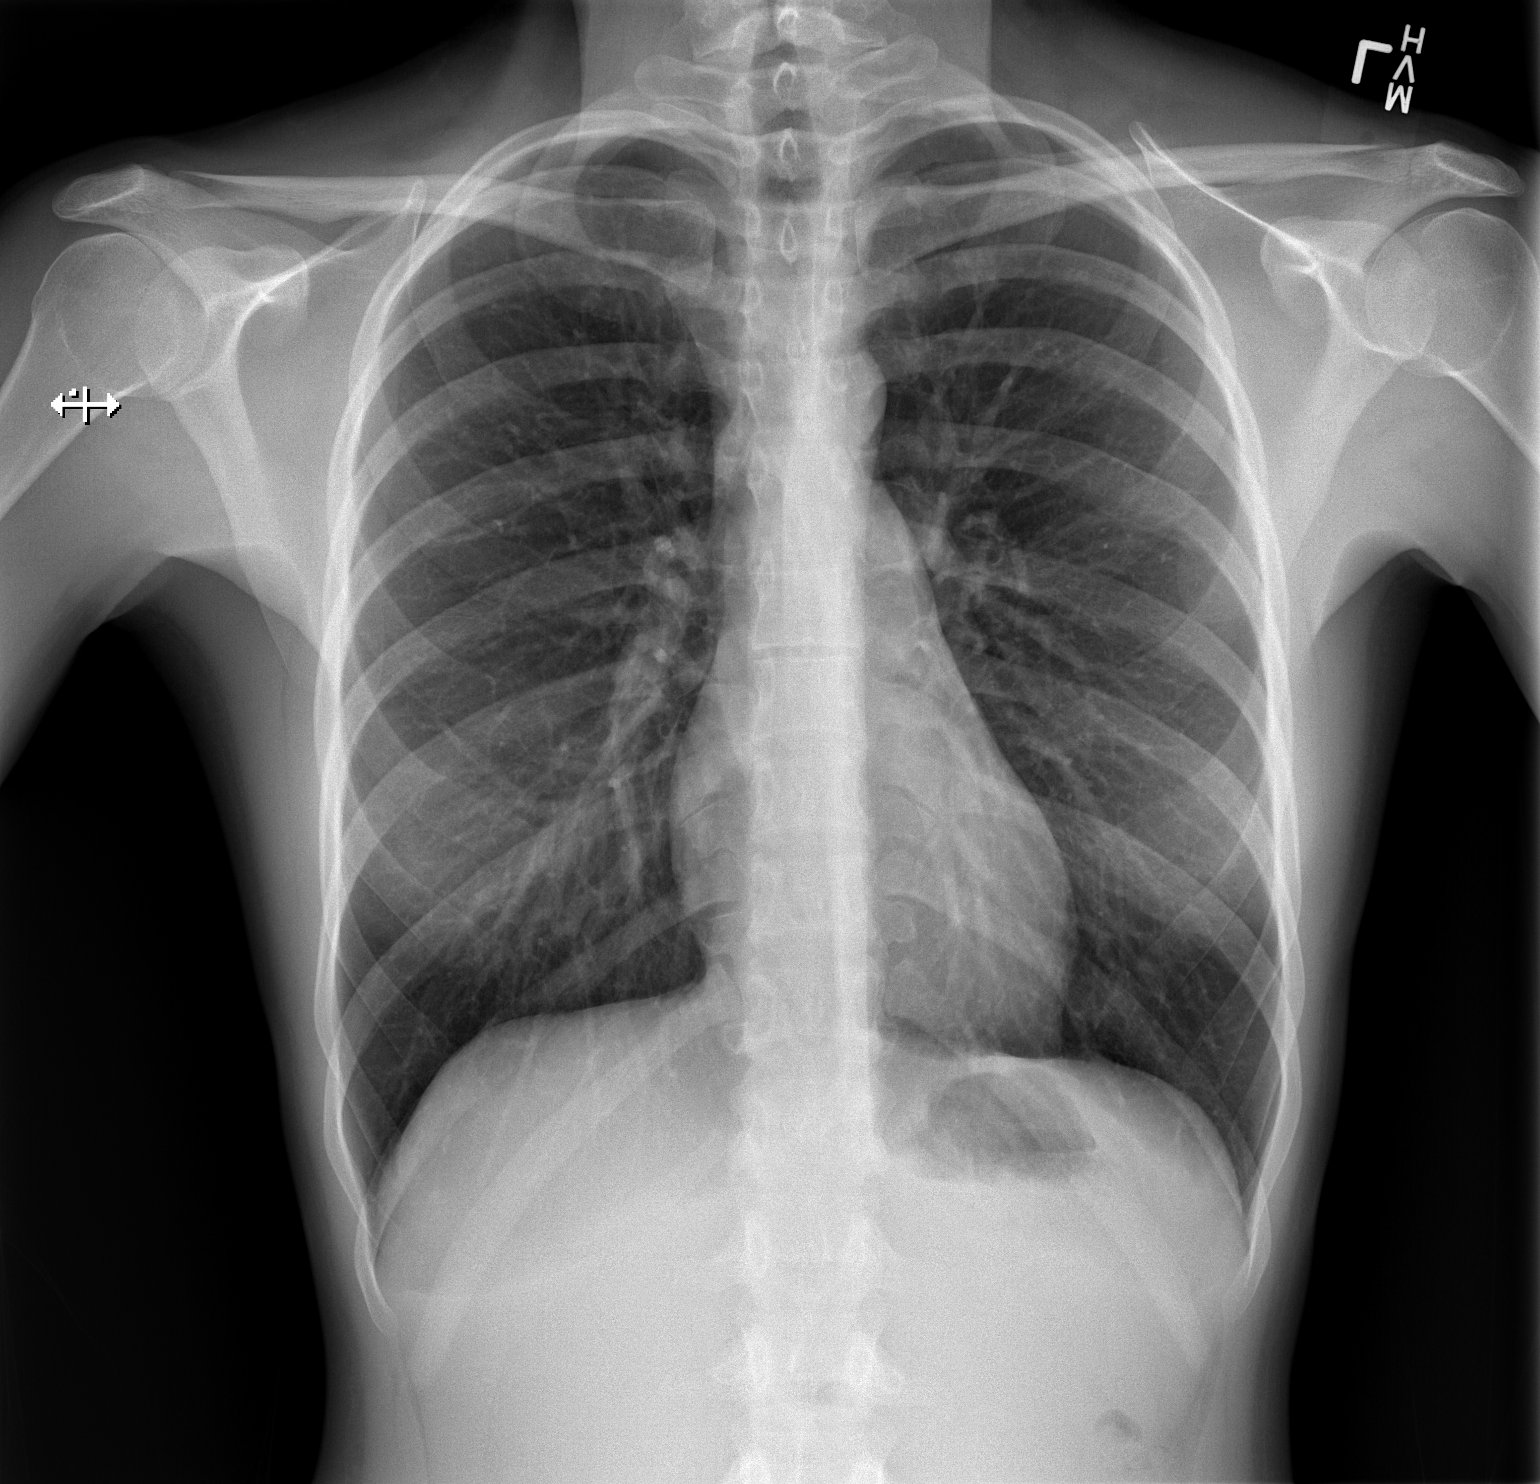

[w chest lat]
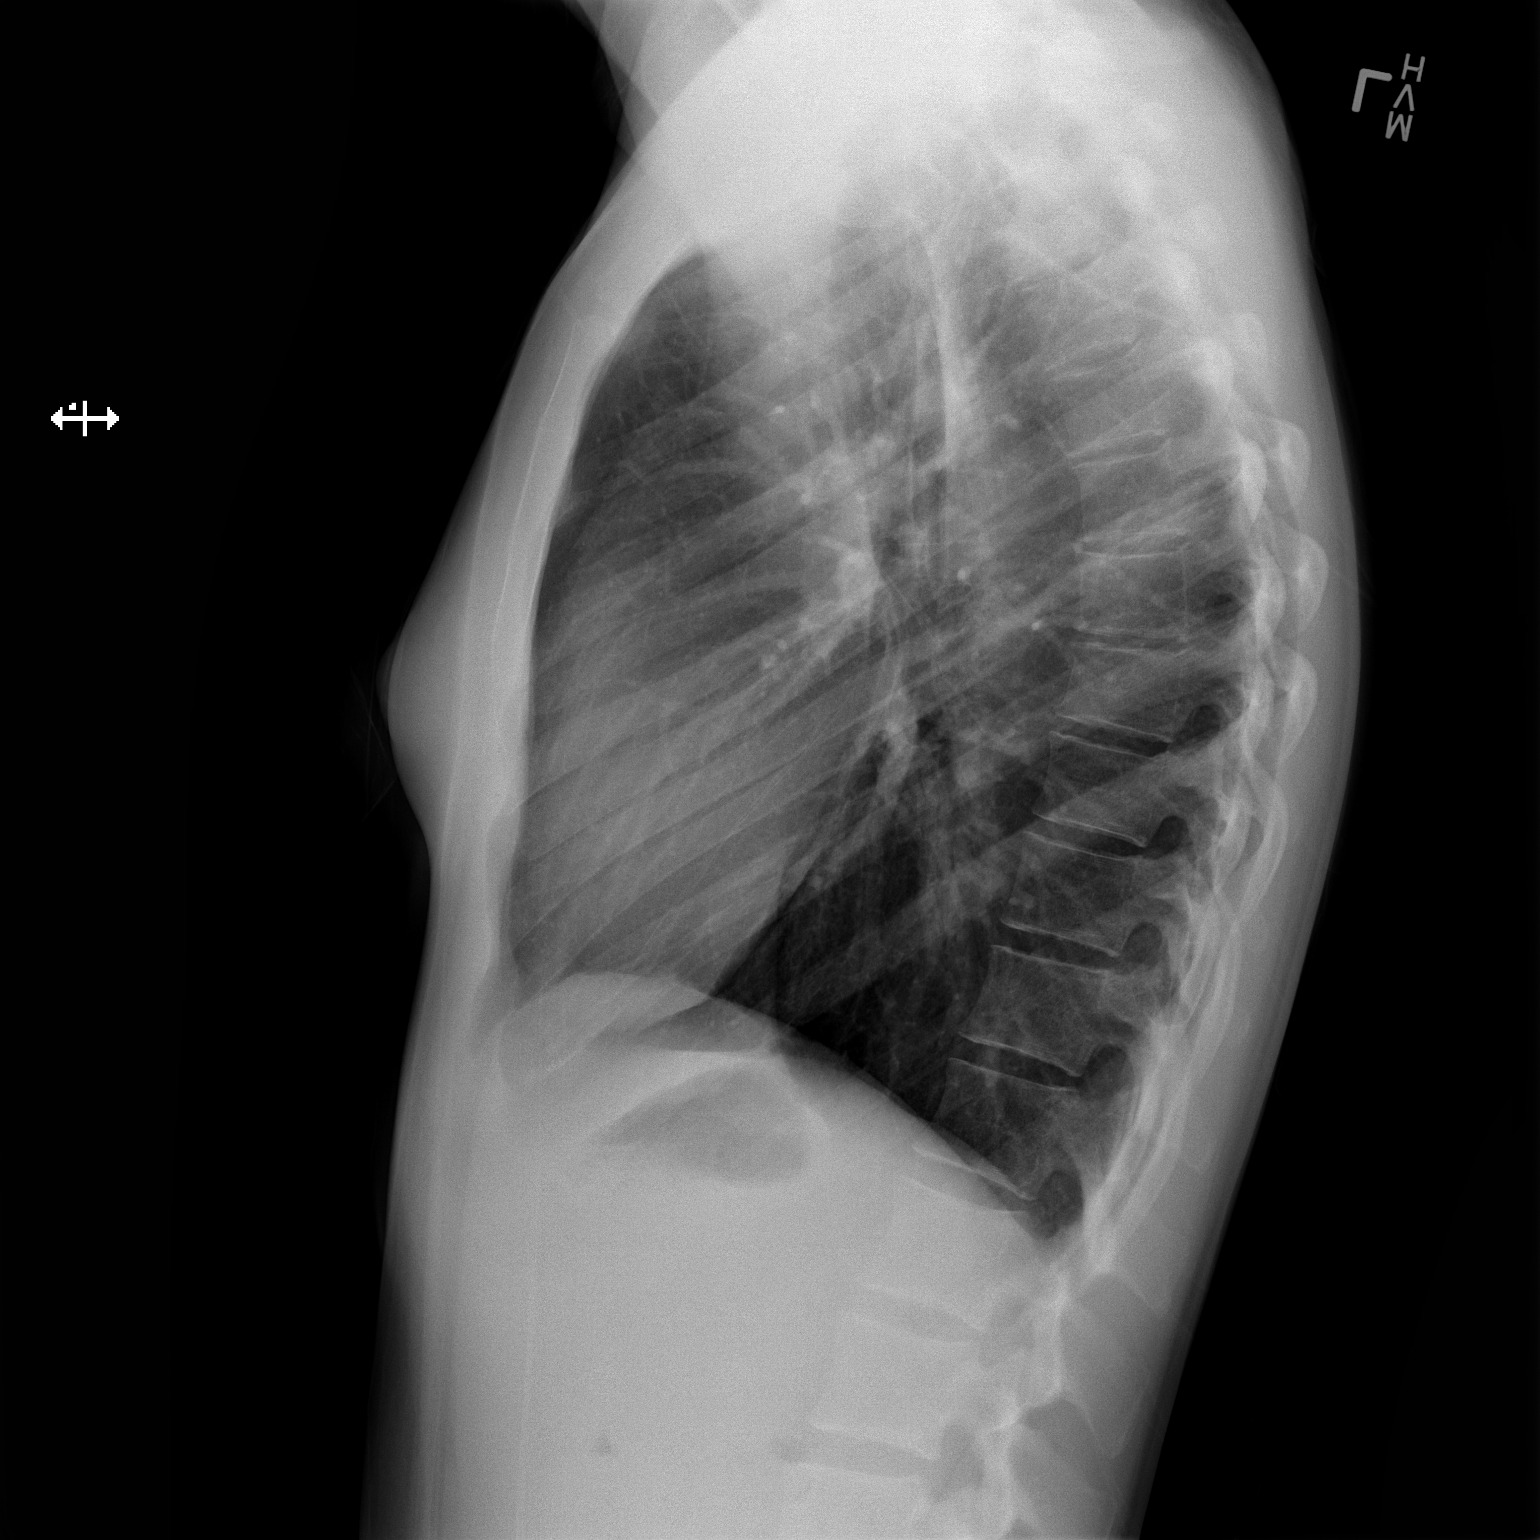

[2 of 2 positions shown; findings below may reference images not displayed]

FINDINGS: Lungs are clear. The heart size and pulmonary vascularity are
normal. No adenopathy. No bone lesions. No pneumothorax.
IMPRESSION: No edema or consolidation.
